# Patient Record
Sex: Female | Born: 1965 | Race: Black or African American | Hispanic: No | Marital: Married | Smoking: Never smoker
Health system: Southern US, Community
[De-identification: ages and names within clinical notes are randomized; demographics above are authoritative.]

## PROBLEM LIST (undated history)

## (undated) DIAGNOSIS — I10 Essential (primary) hypertension: Secondary | ICD-10-CM

## (undated) DIAGNOSIS — D573 Sickle-cell trait: Secondary | ICD-10-CM

---

## 1997-09-25 ENCOUNTER — Emergency Department (HOSPITAL_COMMUNITY): Admission: EM | Admit: 1997-09-25 | Discharge: 1997-09-25 | Payer: Self-pay | Admitting: Emergency Medicine

## 1998-09-25 ENCOUNTER — Emergency Department (HOSPITAL_COMMUNITY): Admission: EM | Admit: 1998-09-25 | Discharge: 1998-09-25 | Payer: Self-pay | Admitting: Emergency Medicine

## 2000-11-13 ENCOUNTER — Emergency Department (HOSPITAL_COMMUNITY): Admission: EM | Admit: 2000-11-13 | Discharge: 2000-11-13 | Payer: Self-pay | Admitting: *Deleted

## 2001-10-29 ENCOUNTER — Emergency Department (HOSPITAL_COMMUNITY): Admission: EM | Admit: 2001-10-29 | Discharge: 2001-10-29 | Payer: Self-pay | Admitting: Emergency Medicine

## 2003-06-24 ENCOUNTER — Emergency Department (HOSPITAL_COMMUNITY): Admission: EM | Admit: 2003-06-24 | Discharge: 2003-06-24 | Payer: Self-pay | Admitting: Emergency Medicine

## 2005-06-21 ENCOUNTER — Emergency Department (HOSPITAL_COMMUNITY): Admission: EM | Admit: 2005-06-21 | Discharge: 2005-06-21 | Payer: Self-pay | Admitting: Emergency Medicine

## 2006-02-05 ENCOUNTER — Emergency Department (HOSPITAL_COMMUNITY): Admission: EM | Admit: 2006-02-05 | Discharge: 2006-02-05 | Payer: Self-pay | Admitting: Emergency Medicine

## 2006-05-09 ENCOUNTER — Emergency Department (HOSPITAL_COMMUNITY): Admission: EM | Admit: 2006-05-09 | Discharge: 2006-05-09 | Payer: Self-pay | Admitting: Emergency Medicine

## 2006-05-23 ENCOUNTER — Emergency Department (HOSPITAL_COMMUNITY): Admission: EM | Admit: 2006-05-23 | Discharge: 2006-05-23 | Payer: Self-pay | Admitting: Emergency Medicine

## 2006-08-26 ENCOUNTER — Emergency Department (HOSPITAL_COMMUNITY): Admission: EM | Admit: 2006-08-26 | Discharge: 2006-08-27 | Payer: Self-pay | Admitting: Emergency Medicine

## 2007-07-27 ENCOUNTER — Emergency Department (HOSPITAL_COMMUNITY): Admission: EM | Admit: 2007-07-27 | Discharge: 2007-07-27 | Payer: Self-pay | Admitting: Emergency Medicine

## 2007-10-13 ENCOUNTER — Emergency Department (HOSPITAL_COMMUNITY): Admission: EM | Admit: 2007-10-13 | Discharge: 2007-10-13 | Payer: Self-pay | Admitting: Emergency Medicine

## 2010-11-16 LAB — TYPE AND SCREEN: Antibody Screen: NEGATIVE

## 2010-11-16 LAB — URINE MICROSCOPIC-ADD ON

## 2010-11-16 LAB — CBC
HCT: 25.4 — ABNORMAL LOW
Platelets: 385
RBC: 4.22
WBC: 6.8

## 2010-11-16 LAB — COMPREHENSIVE METABOLIC PANEL
AST: 17
Albumin: 3.5
Alkaline Phosphatase: 46
BUN: 9
CO2: 28
Chloride: 107
GFR calc non Af Amer: >60
Potassium: 3.7
Total Bilirubin: 0.5

## 2010-11-16 LAB — DIFFERENTIAL
Band Neutrophils: 0
Blasts: 0
Lymphocytes Relative: 28
Metamyelocytes Relative: 0
Promyelocytes Absolute: 0

## 2010-11-16 LAB — PREGNANCY, URINE: Preg Test, Ur: NEGATIVE

## 2010-11-16 LAB — URINALYSIS, ROUTINE W REFLEX MICROSCOPIC
Glucose, UA: NEGATIVE
Leukocytes, UA: NEGATIVE
pH: 7

## 2010-11-16 LAB — OCCULT BLOOD X 1 CARD TO LAB, STOOL: Fecal Occult Bld: NEGATIVE

## 2011-05-29 ENCOUNTER — Emergency Department (HOSPITAL_COMMUNITY)
Admission: EM | Admit: 2011-05-29 | Discharge: 2011-05-30 | Disposition: A | Discharge status: Home / Self Care | Payer: BC Managed Care – PPO | Attending: Emergency Medicine | Admitting: Emergency Medicine

## 2011-05-29 DIAGNOSIS — R51 Headache: Secondary | ICD-10-CM | POA: Insufficient documentation

## 2011-05-29 DIAGNOSIS — R42 Dizziness and giddiness: Secondary | ICD-10-CM | POA: Insufficient documentation

## 2011-05-29 DIAGNOSIS — I10 Essential (primary) hypertension: Secondary | ICD-10-CM | POA: Insufficient documentation

## 2011-05-29 HISTORY — DX: Sickle-cell trait: D57.3

## 2011-05-29 HISTORY — DX: Essential (primary) hypertension: I10

## 2011-05-30 ENCOUNTER — Encounter (HOSPITAL_COMMUNITY): Payer: Self-pay

## 2011-05-30 ENCOUNTER — Emergency Department (HOSPITAL_COMMUNITY): Payer: BC Managed Care – PPO

## 2011-05-30 MED ORDER — LORAZEPAM 2 MG/ML IJ SOLN
1.0000 mg | Freq: Once | INTRAMUSCULAR | Status: AC
  Administered 2011-05-30: 1 mg via INTRAVENOUS
  Filled 2011-05-30: qty 1

## 2011-05-30 MED ORDER — MECLIZINE HCL 25 MG PO TABS
25.0000 mg | ORAL_TABLET | Freq: Once | ORAL | Status: AC
  Administered 2011-05-30: 25 mg via ORAL
  Filled 2011-05-30: qty 1

## 2011-05-30 MED ORDER — SODIUM CHLORIDE 0.9 % IV BOLUS (SEPSIS): 1000.0000 mL | Freq: Once | INTRAVENOUS | Status: DC

## 2011-05-30 MED ORDER — MECLIZINE HCL 25 MG PO TABS: 25.0000 mg | ORAL_TABLET | Freq: Four times a day (QID) | ORAL | Status: AC

## 2011-05-30 NOTE — ED Provider Notes (Addendum)
History     CSN: [id number]  Arrival date & time 05/29/11  2348   First MD Initiated Contact with Patient 05/30/11 0007     Chief complaint dizziness  (Consider location/radiation/quality/duration/timing/severity/associated sxs/prior treatment) HPI History is provided by patient. Went to bed tonight and sat up with sudden onset of severe dizziness. She has had some recent allergies with eye irritation and congestion but no history of dizziness like this. She is able to walk. No weakness or numbness. She has sickle cell trait but no sickle cell disease. She denies any headache. No neck pain. No ear pain or drainage. No sore throat. No sick contacts. No head injury. No change in vision or speech. Patient denies any medications. States she's been under significant amount of stress recently.   Past Medical History  Diagnosis Date  . Sickle cell trait   . Hypertension     History reviewed. No pertinent past surgical history.  No family history on file.  History  Substance Use Topics  . Smoking status: Never Smoker   . Smokeless tobacco: Not on file  . Alcohol Use: No    OB History    Grav Para Term Preterm Abortions TAB SAB Ect Mult Living                  Review of Systems  Constitutional: Negative for fever and chills.  HENT: Negative for neck pain and neck stiffness.   Eyes: Negative for pain.  Respiratory: Negative for shortness of breath.   Cardiovascular: Negative for chest pain.  Gastrointestinal: Negative for abdominal pain.  Genitourinary: Negative for dysuria.  Musculoskeletal: Negative for back pain.  Skin: Negative for rash.  Neurological: Positive for dizziness. Negative for seizures, syncope, speech difficulty, weakness and numbness.  All other systems reviewed and are negative.    Allergies  Review of patient's allergies indicates no known allergies.  Home Medications   Current Outpatient Rx  Name Route Sig Dispense Refill  . HYDROCHLOROTHIAZIDE  25 MG PO TABS Oral Take 25 mg by mouth daily.      BP 135/74  Pulse 60  Temp(Src) 97.4 F (36.3 C) (Oral)  Resp 16  Ht 5\' 6"  (1.676 m)  Wt 155 lb (70.308 kg)  BMI 25.02 kg/m2  SpO2 100%  Physical Exam  Constitutional: She is oriented to person, place, and time. She appears well-developed and well-nourished.  HENT:  Head: Normocephalic and atraumatic.  Right Ear: External ear normal.  Left Ear: External ear normal.  Nose: Nose normal.  Mouth/Throat: Oropharynx is clear and moist. No oropharyngeal exudate.  Eyes: Conjunctivae and EOM are normal. Pupils are equal, round, and reactive to light.  Neck: Full passive range of motion without pain. Neck supple. No thyromegaly present.       No meningismus  Cardiovascular: Normal rate, regular rhythm, S1 normal, S2 normal and intact distal pulses.   Pulmonary/Chest: Effort normal and breath sounds normal.  Abdominal: Soft. Bowel sounds are normal. There is no tenderness. There is no CVA tenderness.  Musculoskeletal: Normal range of motion.  Neurological: She is alert and oriented to person, place, and time. She has normal strength and normal reflexes. No cranial nerve deficit or sensory deficit. She displays a negative Romberg sign. GCS eye subscore is 4. GCS verbal subscore is 5. GCS motor subscore is 6.       Normal Gait  Skin: Skin is warm and dry. No rash noted. No cyanosis. Nails show no clubbing.  Psychiatric: She has a  normal mood and affect. Her speech is normal and behavior is normal.    ED Course  Procedures (including critical care time)  Labs Reviewed - No data to display Ct Head Wo Contrast  05/30/2011  *RADIOLOGY REPORT*  Clinical Data: Seven generalized frontal headache.  Dizziness.  CT HEAD WITHOUT CONTRAST  Technique:  Contiguous axial images were obtained from the base of the skull through the vertex without contrast.  Comparison: None.  Findings: No mass effect, midline shift, or acute intracranial hemorrhage.   IMPRESSION: Negative head CT.  Original Report Authenticated By: Donavan Burnet, M.D.     Date: 05/30/2011  Rate: 57  Rhythm: sinus bradycardia  QRS Axis: normal  Intervals: normal  ST/T Wave abnormalities: early repolarization  Conduction Disutrbances:none  Narrative Interpretation: Mild diffuse ST elevation with some PR depression  Old EKG Reviewed: none available   IV fluids. Ativan. Antivert.  Patient ambulates with normal gait. No neuro deficits.  Recheck after medications feeling improved. No change on normal neuro exam MDM   Location suggests benign positional vertigo. Reproducible symptoms with lateral deviation of eye movements. No significant nystagmus appreciated. Presentation does not suggest stroke and CT scan reviewed as above. Plan discharge home with vertigo precautions, Antivert, primary care followup as needed. Reliable historian verbalizes understanding all discharge and followup instructions and agrees to strict return precautions        Sunnie Nielsen, MD 05/30/11 0132  Sunnie Nielsen, MD 05/30/11 [phone]

## 2011-05-30 NOTE — Discharge Instructions (Signed)

## 2011-05-30 NOTE — ED Notes (Signed)
Pt states she felt dizzy when she got up to use the bathroom at home. She stated she had to crawl. Pt. Is alert and oriented x 4. Denies H/A. Stated that she gets these episodes frequent.

## 2011-05-30 NOTE — ED Notes (Signed)
Pt report sudden onset of generalized frontal area headache.  Pt c/o of dizziness while in bed and was unable to get up- "room was spinning"

## 2013-03-07 ENCOUNTER — Encounter (HOSPITAL_COMMUNITY): Payer: Self-pay | Admitting: Emergency Medicine

## 2013-03-07 ENCOUNTER — Emergency Department (HOSPITAL_COMMUNITY)
Admission: EM | Admit: 2013-03-07 | Discharge: 2013-03-07 | Disposition: A | Discharge status: Home / Self Care | Payer: BC Managed Care – PPO | Attending: Emergency Medicine | Admitting: Emergency Medicine

## 2013-03-07 DIAGNOSIS — T6391XA Toxic effect of contact with unspecified venomous animal, accidental (unintentional), initial encounter: Secondary | ICD-10-CM | POA: Insufficient documentation

## 2013-03-07 DIAGNOSIS — IMO0002 Reserved for concepts with insufficient information to code with codable children: Secondary | ICD-10-CM | POA: Insufficient documentation

## 2013-03-07 DIAGNOSIS — L0390 Cellulitis, unspecified: Secondary | ICD-10-CM

## 2013-03-07 DIAGNOSIS — I10 Essential (primary) hypertension: Secondary | ICD-10-CM | POA: Insufficient documentation

## 2013-03-07 DIAGNOSIS — Y9389 Activity, other specified: Secondary | ICD-10-CM | POA: Insufficient documentation

## 2013-03-07 DIAGNOSIS — T63391A Toxic effect of venom of other spider, accidental (unintentional), initial encounter: Secondary | ICD-10-CM | POA: Insufficient documentation

## 2013-03-07 DIAGNOSIS — Z862 Personal history of diseases of the blood and blood-forming organs and certain disorders involving the immune mechanism: Secondary | ICD-10-CM | POA: Insufficient documentation

## 2013-03-07 DIAGNOSIS — Y929 Unspecified place or not applicable: Secondary | ICD-10-CM | POA: Insufficient documentation

## 2013-03-07 MED ORDER — SULFAMETHOXAZOLE-TRIMETHOPRIM 800-160 MG PO TABS: 1.0000 | ORAL_TABLET | Freq: Two times a day (BID) | ORAL | Status: DC

## 2013-03-07 MED ORDER — CEPHALEXIN 500 MG PO CAPS: 500.0000 mg | ORAL_CAPSULE | Freq: Four times a day (QID) | ORAL | Status: DC

## 2013-03-07 NOTE — ED Notes (Signed)
Pt. Stated, i was cleaning and a spider got on my left forearm and bit me.  Left forearm red

## 2013-03-07 NOTE — ED Notes (Signed)
PT comfortable with d/c and f/u instructions. Prescriptions x2 

## 2013-03-07 NOTE — ED Provider Notes (Signed)
CSN: [id number]     Arrival date & time 03/07/13  1627 History   This chart was scribed for non-physician practitioner Arthor Captain, PA-C, working with Juliet Rude. Rubin Payor, MD by Donne Anon, ED Scribe. This patient was seen in room TR05C/TR05C and the patient's care was started at 1652.   First MD Initiated Contact with Patient 03/07/13 1652     Chief Complaint  Patient presents with  . spider bite     The history is provided by the patient. No language interpreter was used.   HPI Comments: Cassie Reed is a 48 y.o. female with hx of HTN, who presents to the Emergency Department complaining of gradual onset, gradually worsening left forearm redness and swelling that began yesterday morning. She states she saw a small spider bit her arm yesterday morning and the symptoms began shortly after. She denies fever, chills, shakes or any other symptoms.   Past Medical History  Diagnosis Date  . Sickle cell trait   . Hypertension    History reviewed. No pertinent past surgical history. No family history on file. History  Substance Use Topics  . Smoking status: Never Smoker   . Smokeless tobacco: Not on file  . Alcohol Use: No   OB History   Grav Para Term Preterm Abortions TAB SAB Ect Mult Living                 Review of Systems  Constitutional: Negative for fever.  Respiratory: Negative for cough.   Gastrointestinal: Negative for vomiting.  Skin: Positive for color change and rash.    Allergies  Review of patient's allergies indicates no known allergies.  Home Medications   Current Outpatient Rx  Name  Route  Sig  Dispense  Refill  . hydrochlorothiazide (HYDRODIURIL) 25 MG tablet   Oral   Take 25 mg by mouth daily.          BP 162/102  Pulse 85  Temp(Src) 97.6 F (36.4 C) (Oral)  Resp 18  Ht 5\' 6"  (1.676 m)  Wt 173 lb (78.472 kg)  BMI 27.94 kg/m2  SpO2 98%  Physical Exam  Nursing note and vitals reviewed. Constitutional: She appears well-developed and  well-nourished. No distress.  HENT:  Head: Normocephalic and atraumatic.  Eyes: Conjunctivae are normal.  Neck: Neck supple. No tracheal deviation present.  Cardiovascular: Normal rate.   Pulmonary/Chest: Effort normal. No respiratory distress.  Musculoskeletal: Normal range of motion.  Neurological: She is alert.  Skin: Skin is warm and dry. Rash noted.  Left arm just inferior to elbow on extensor surface has central area of excoriation surrounded by swelling and erythema with extension about the elbow. No streaking. Tender to palpation. No discharge.   Psychiatric: She has a normal mood and affect. Her behavior is normal.    ED Course  Procedures (including critical care time) DIAGNOSTIC STUDIES: Oxygen Saturation is 98% on RA, normal by my interpretation.    COORDINATION OF CARE: 6:26 PM Discussed treatment plan with pt at bedside and pt agreed to plan. Return precautions advised. Will discharge home with Spetra DS and Keflex.    Labs Review Labs Reviewed - No data to display Imaging Review No results found.  EKG Interpretation   None       MDM   1. Cellulitis    .patient with cellulitis of the the left forearm She will be discharged on bactrim and keflex. Discussed return precautions. No signs fo compartment syndrome or necrosis.   I personally  performed the services described in this documentation, which was scribed in my presence. The recorded information has been reviewed and is accurate.    Arthor Captainbigail Krizia Flight, PA-C 03/08/13 2116

## 2013-03-07 NOTE — Discharge Instructions (Signed)
You have been diagnosed with cellulitis. I am discharging you with the following medications: 1)bactrim and keflex Please take all of your antibiotics until finished!   You may develop abdominal discomfort or diarrhea from the antibiotic.  You may help offset this with probiotics which you can buy or get in yogurt. Do not eat  or take the probiotics until 2 hours after your antibiotic.   HOME CARE INSTRUCTIONS   Take your antibiotics as directed. Finish them even if you start to feel better.  Keep the infected arm or leg elevated to reduce swelling.  Apply a warm cloth to the affected area up to 4 times per day to relieve pain.  Only take over-the-counter or prescription medicines for pain, discomfort, or fever as directed by your caregiver.  Keep all follow-up appointments as directed by your caregiver. SEEK MEDICAL CARE IF:   You notice red streaks coming from the infected area.  Your red area gets larger or turns dark in color.  Your bone or joint underneath the infected area becomes painful after the skin has healed.  Your infection returns in the same area or another area.  You notice a swollen bump in the infected area.  You develop new symptoms. SEEK IMMEDIATE MEDICAL CARE IF:   You have a fever.  You feel very sleepy.  You develop vomiting or diarrhea.  You have a general ill feeling (malaise) with muscle aches and pains.

## 2013-03-07 NOTE — ED Notes (Signed)
The pt is c/o  A redness and swelling of her lt forearm just below the elbow.  She saw a spider on her arm yesterday and the pain and redness started after that

## 2013-03-12 NOTE — ED Provider Notes (Signed)
Medical screening examination/treatment/procedure(s) were performed by non-physician practitioner and as supervising physician I was immediately available for consultation/collaboration.  EKG Interpretation   None        Caydon Feasel R. Darrik Richman, MD 03/12/13 2046 

## 2013-09-20 ENCOUNTER — Encounter (HOSPITAL_COMMUNITY): Payer: Self-pay | Admitting: Emergency Medicine

## 2013-09-20 ENCOUNTER — Emergency Department (HOSPITAL_COMMUNITY): Payer: BC Managed Care – PPO

## 2013-09-20 ENCOUNTER — Emergency Department (HOSPITAL_COMMUNITY)
Admission: EM | Admit: 2013-09-20 | Discharge: 2013-09-20 | Disposition: A | Discharge status: Home / Self Care | Payer: BC Managed Care – PPO | Attending: Emergency Medicine | Admitting: Emergency Medicine

## 2013-09-20 DIAGNOSIS — S0993XA Unspecified injury of face, initial encounter: Secondary | ICD-10-CM | POA: Insufficient documentation

## 2013-09-20 DIAGNOSIS — Z862 Personal history of diseases of the blood and blood-forming organs and certain disorders involving the immune mechanism: Secondary | ICD-10-CM | POA: Diagnosis not present

## 2013-09-20 DIAGNOSIS — Y9289 Other specified places as the place of occurrence of the external cause: Secondary | ICD-10-CM | POA: Diagnosis not present

## 2013-09-20 DIAGNOSIS — I10 Essential (primary) hypertension: Secondary | ICD-10-CM | POA: Insufficient documentation

## 2013-09-20 DIAGNOSIS — M62838 Other muscle spasm: Secondary | ICD-10-CM

## 2013-09-20 DIAGNOSIS — S199XXA Unspecified injury of neck, initial encounter: Secondary | ICD-10-CM | POA: Diagnosis present

## 2013-09-20 DIAGNOSIS — Z792 Long term (current) use of antibiotics: Secondary | ICD-10-CM | POA: Insufficient documentation

## 2013-09-20 DIAGNOSIS — W1809XA Striking against other object with subsequent fall, initial encounter: Secondary | ICD-10-CM | POA: Diagnosis not present

## 2013-09-20 DIAGNOSIS — Y9389 Activity, other specified: Secondary | ICD-10-CM | POA: Insufficient documentation

## 2013-09-20 DIAGNOSIS — Z79899 Other long term (current) drug therapy: Secondary | ICD-10-CM | POA: Diagnosis not present

## 2013-09-20 MED ORDER — IBUPROFEN 800 MG PO TABS: 800.0000 mg | ORAL_TABLET | Freq: Three times a day (TID) | ORAL | Status: DC

## 2013-09-20 MED ORDER — METHOCARBAMOL 500 MG PO TABS: 500.0000 mg | ORAL_TABLET | Freq: Two times a day (BID) | ORAL | Status: DC

## 2013-09-20 MED ORDER — METHOCARBAMOL 500 MG PO TABS
500.0000 mg | ORAL_TABLET | Freq: Once | ORAL | Status: AC
  Administered 2013-09-20: 500 mg via ORAL
  Filled 2013-09-20: qty 1

## 2013-09-20 MED ORDER — IBUPROFEN 800 MG PO TABS
800.0000 mg | ORAL_TABLET | Freq: Once | ORAL | Status: AC
  Administered 2013-09-20: 800 mg via ORAL
  Filled 2013-09-20: qty 1

## 2013-09-20 NOTE — Discharge Instructions (Signed)
Please follow up with your primary care physician in 1-2 days. If you do not have one please call the [facility] number listed above. Please take pain medication and/or muscle relaxants as prescribed and as needed for pain. Please do not drive on narcotic pain medication or on muscle relaxants. Please read all discharge instructions and return precautions.  ° °Muscle Cramps and Spasms °Muscle cramps and spasms occur when a muscle or muscles tighten and you have no control over this tightening (involuntary muscle contraction). They are a common problem and can develop in any muscle. The most common place is in the calf muscles of the leg. Both muscle cramps and muscle spasms are involuntary muscle contractions, but they also have differences:  °· Muscle cramps are sporadic and painful. They may last a few seconds to a quarter of an hour. Muscle cramps are often more forceful and last longer than muscle spasms. °· Muscle spasms may or may not be painful. They may also last just a few seconds or much longer. °CAUSES  °It is uncommon for cramps or spasms to be due to a serious underlying problem. In many cases, the cause of cramps or spasms is unknown. Some common causes are:  °· Overexertion.   °· Overuse from repetitive motions (doing the same thing over and over).   °· Remaining in a certain position for a long period of time.   °· Improper preparation, form, or technique while performing a sport or activity.   °· Dehydration.   °· Injury.   °· Side effects of some medicines.   °· Abnormally low levels of the salts and ions in your blood (electrolytes), especially potassium and calcium. This could happen if you are taking water pills (diuretics) or you are pregnant.   °Some underlying medical problems can make it more likely to develop cramps or spasms. These include, but are not limited to:  °· Diabetes.   °· Parkinson disease.   °· Hormone disorders, such as thyroid problems.   °· Alcohol  abuse.   °· Diseases specific to muscles, joints, and bones.   °· Blood vessel disease where not enough blood is getting to the muscles.   °HOME CARE INSTRUCTIONS  °· Stay well hydrated. Drink enough water and fluids to keep your urine clear or pale yellow. °· It may be helpful to massage, stretch, and relax the affected muscle. °· For tight or tense muscles, use a warm towel, heating pad, or hot shower water directed to the affected area. °· If you are sore or have pain after a cramp or spasm, applying ice to the affected area may relieve discomfort. °¨ Put ice in a plastic bag. °¨ Place a towel between your skin and the bag. °¨ Leave the ice on for 15-20 minutes, 03-04 times a day. °· Medicines used to treat a known cause of cramps or spasms may help reduce their frequency or severity. Only take over-the-counter or prescription medicines as directed by your caregiver. °SEEK MEDICAL CARE IF:  °Your cramps or spasms get more severe, more frequent, or do not improve over time.  °MAKE SURE YOU:  °· Understand these instructions. °· Will watch your condition. °· Will get help right away if you are not doing well or get worse. °Document Released: 07/10/2001 Document Revised: 05/15/2012 Document Reviewed: 01/05/2012 °[employer]® Patient Information ©2015 [employer]. This information is not intended to replace advice given to you by your health care provider. Make sure you discuss any questions you have with your health care provider. ° °

## 2013-09-20 NOTE — ED Provider Notes (Signed)
Medical screening examination/treatment/procedure(s) were performed by non-physician practitioner and as supervising physician I was immediately available for consultation/collaboration.   Toy BakerAnthony T Lumi Winslett, MD 09/20/13 [phone]
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
## 2014-09-29 ENCOUNTER — Emergency Department (HOSPITAL_COMMUNITY)
Admission: EM | Admit: 2014-09-29 | Discharge: 2014-09-29 | Disposition: A | Discharge status: Home / Self Care | Payer: BLUE CROSS/BLUE SHIELD | Attending: Emergency Medicine | Admitting: Emergency Medicine

## 2014-09-29 ENCOUNTER — Encounter (HOSPITAL_COMMUNITY): Payer: Self-pay

## 2014-09-29 DIAGNOSIS — I10 Essential (primary) hypertension: Secondary | ICD-10-CM | POA: Diagnosis not present

## 2014-09-29 DIAGNOSIS — M5440 Lumbago with sciatica, unspecified side: Secondary | ICD-10-CM | POA: Insufficient documentation

## 2014-09-29 DIAGNOSIS — Z862 Personal history of diseases of the blood and blood-forming organs and certain disorders involving the immune mechanism: Secondary | ICD-10-CM | POA: Diagnosis not present

## 2014-09-29 DIAGNOSIS — Z79899 Other long term (current) drug therapy: Secondary | ICD-10-CM | POA: Diagnosis not present

## 2014-09-29 DIAGNOSIS — M545 Low back pain: Secondary | ICD-10-CM | POA: Diagnosis present

## 2014-09-29 MED ORDER — IBUPROFEN 200 MG PO TABS
400.0000 mg | ORAL_TABLET | Freq: Once | ORAL | Status: AC
  Administered 2014-09-29: 400 mg via ORAL
  Filled 2014-09-29: qty 2

## 2014-09-29 MED ORDER — IBUPROFEN 400 MG PO TABS: 400.0000 mg | ORAL_TABLET | Freq: Three times a day (TID) | ORAL | Status: AC

## 2014-09-29 MED ORDER — DIAZEPAM 2 MG PO TABS: 2.0000 mg | ORAL_TABLET | Freq: Two times a day (BID) | ORAL | Status: AC | PRN

## 2014-09-29 MED ORDER — DIAZEPAM 2 MG PO TABS
2.0000 mg | ORAL_TABLET | Freq: Once | ORAL | Status: AC
  Administered 2014-09-29: 2 mg via ORAL
  Filled 2014-09-29: qty 1

## 2014-09-29 NOTE — ED Provider Notes (Signed)
CSN: [id number]     Arrival date & time 09/29/14  0904 History   First MD Initiated Contact with Patient 09/29/14 [phone]     Chief Complaint  Patient presents with  . 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 Feels like previous pulled muscle who went away for 2 days and then came back while she was at work again on Thursday. Patient states that the pain is resolved with rest however certain movements exacerbate the pain. She does not have any midline pain. No trauma to the back of her spine. No urinary or fecal incontinence. No fevers, dysuria. She is going through menopause and does not think she is pregnant.  Patient is a 49 y.o. female presenting with back pain.  Back Pain   Past Medical History  Diagnosis Date  . Sickle cell trait   . Hypertension    History reviewed. No pertinent past surgical history. History reviewed. No pertinent family history. Social History  Substance Use Topics  . Smoking status: Never Smoker   . Smokeless tobacco: None  . Alcohol Use: No   OB History    No data available     Review of Systems  Respiratory: Negative for cough and shortness of breath.   Musculoskeletal: Positive for back pain. Negative for gait problem, neck pain and neck stiffness.  All other systems reviewed and are negative.     Allergies  Review of patient's allergies indicates no known allergies.  Home Medications   Prior to Admission medications   Medication Sig Start Date End Date Taking? Authorizing Provider  diazepam (VALIUM) 2 MG tablet Take 1 tablet (2 mg total) by mouth every 12 (twelve) hours as needed for muscle spasms. 09/29/14   Marily Memos, MD   hydrochlorothiazide (HYDRODIURIL) 25 MG tablet Take 25 mg by mouth daily.    Historical Provider, MD  ibuprofen (ADVIL,MOTRIN) 400 MG tablet Take 1 tablet (400 mg total) by mouth 3 (three) times daily. 09/29/14 10/06/14  Barbara Cower Rakwon Letourneau, MD   BP 166/88 mmHg  Pulse 93  Temp(Src) 98.3 F (36.8 C) (Oral)  Resp 16  SpO2 100% Physical Exam  Constitutional: She is oriented to person, place, and time. She appears well-developed and well-nourished.  HENT:  Head: Normocephalic and atraumatic.  Eyes: Conjunctivae and EOM are normal. Right eye exhibits no discharge. Left eye exhibits no discharge.  Cardiovascular: Normal rate and regular rhythm.   Pulmonary/Chest: Effort normal and breath sounds normal. No respiratory distress.  Abdominal: Soft. She exhibits no distension. There is no tenderness. There is no rebound.  Musculoskeletal: Normal range of motion. She exhibits no edema or tenderness.  Neurological: She is alert and oriented to person, place, and time.  Bilateral lower extremities with equal 5 out of 5 strength in knee flexion and extension, hip flexion and extension, hip abduction and abduction. 2+ deep tendon reflexes in her patella. Normal sensation around inner thigh outer thigh and the rest of her lower extremity.  Skin: Skin is warm and dry.  Nursing note and vitals reviewed.   ED Course  Procedures (including critical care time) Labs Review Labs Reviewed - No data to display  Imaging Review No results found. I have personally reviewed and evaluated these images  and lab results as part of my medical decision-making.   EKG Interpretation None      MDM   Final diagnoses:  Right low back pain, with sciatica presence unspecified   49 year old female with likely muscular strain. We'll recommend stretching, heat pad, supportive therapy at home. Will follow up with her doctor one week if not improving. Doubt that she has any spinal abnormalities (space-occupying lesion, fracture)  without any signs or symptoms of such.  I have personally and contemperaneously reviewed labs and imaging and used in my decision making as above.   A medical screening exam was performed and I feel the patient has had an appropriate workup for their chief complaint at this time and likelihood of emergent condition existing is low. They have been counseled on decision, discharge, follow up and which symptoms necessitate immediate return to the emergency department. They or their family verbally stated understanding and agreement with plan and discharged in stable condition.      Marily Memos, MD 09/29/14 [phone]

## 2014-09-29 NOTE — ED Notes (Signed)
Pt was moving wheelchair at work 3 days ago & began having left-sided back pain/leg pain. Pt ambulatory w/ steady gait. Took ibuprofen and "some muscle spasm pills" w/o relief.

## 2015-02-20 IMAGING — CR DG CERVICAL SPINE COMPLETE 4+V
5 series · 5 of 5 positions shown · non-contrast
Comparison: None.

CLINICAL DATA: Fall.  Pain .

EXAM:
CERVICAL SPINE  4+ VIEWS

[w cervical spine lat]
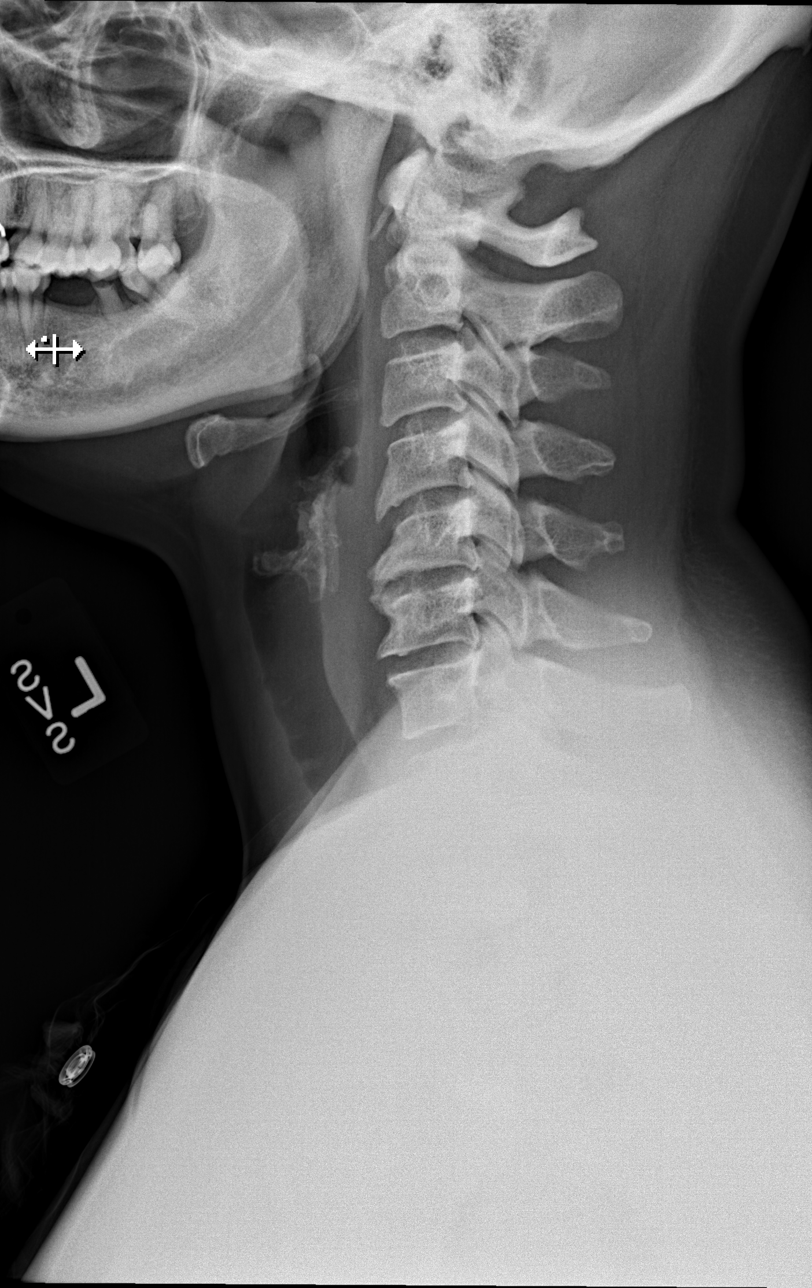

[w cervical spine ap (1 of 3)]
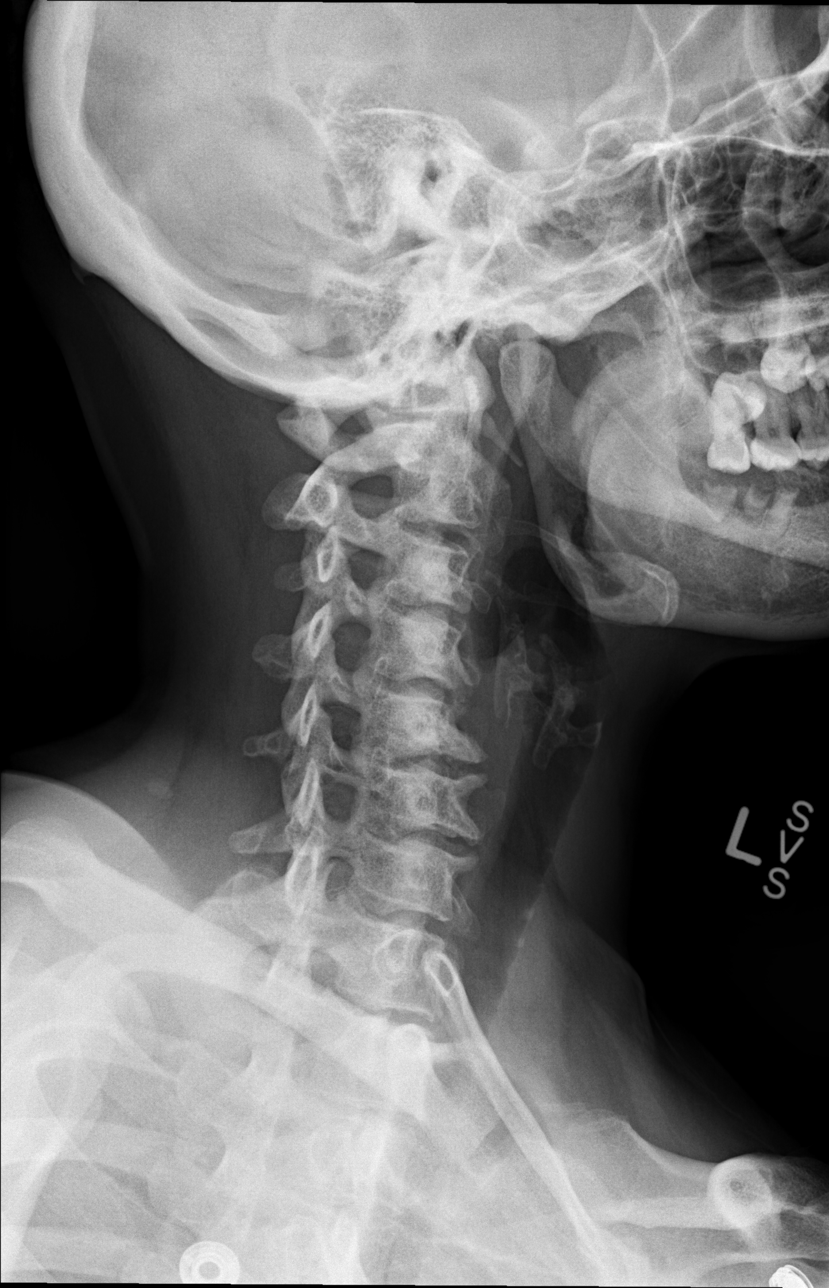

[w cervical spine ap (2 of 3)]
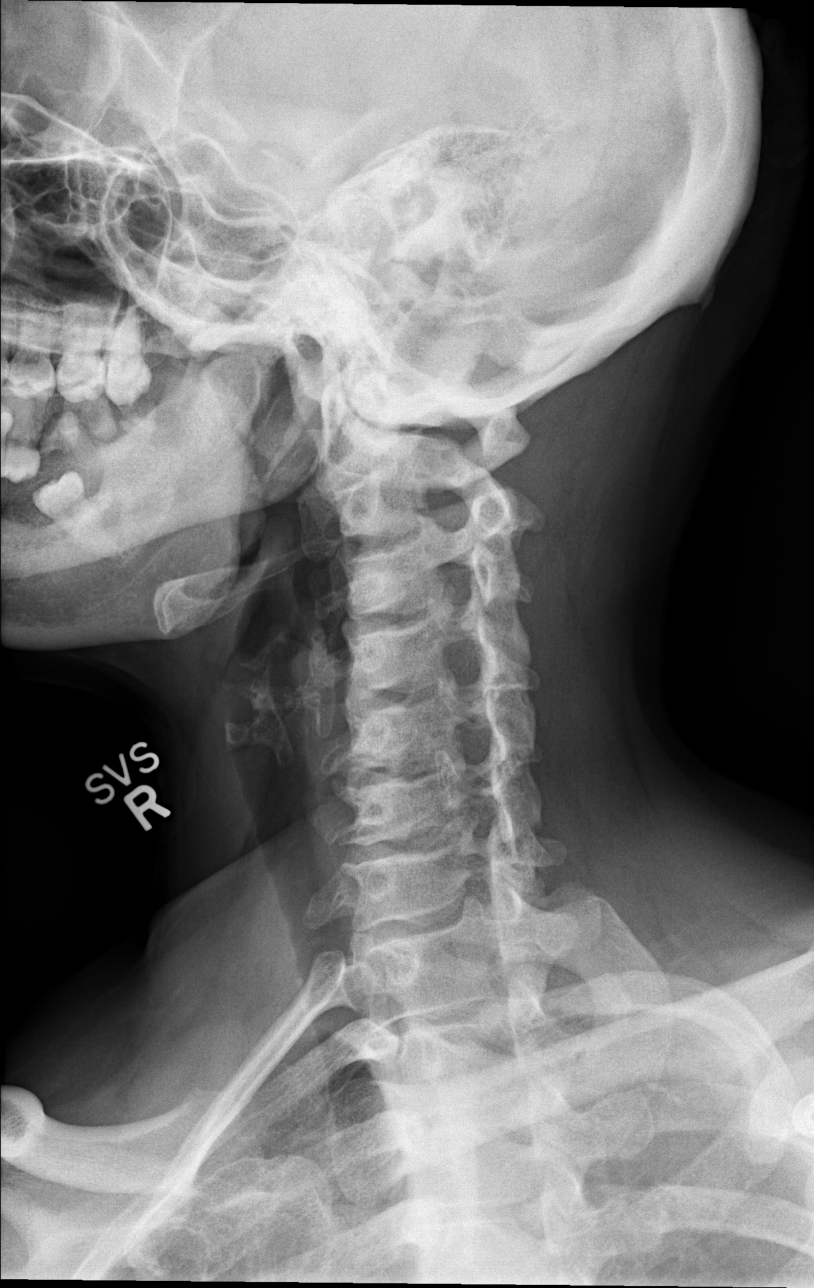

[w cervical spine ap (3 of 3)]
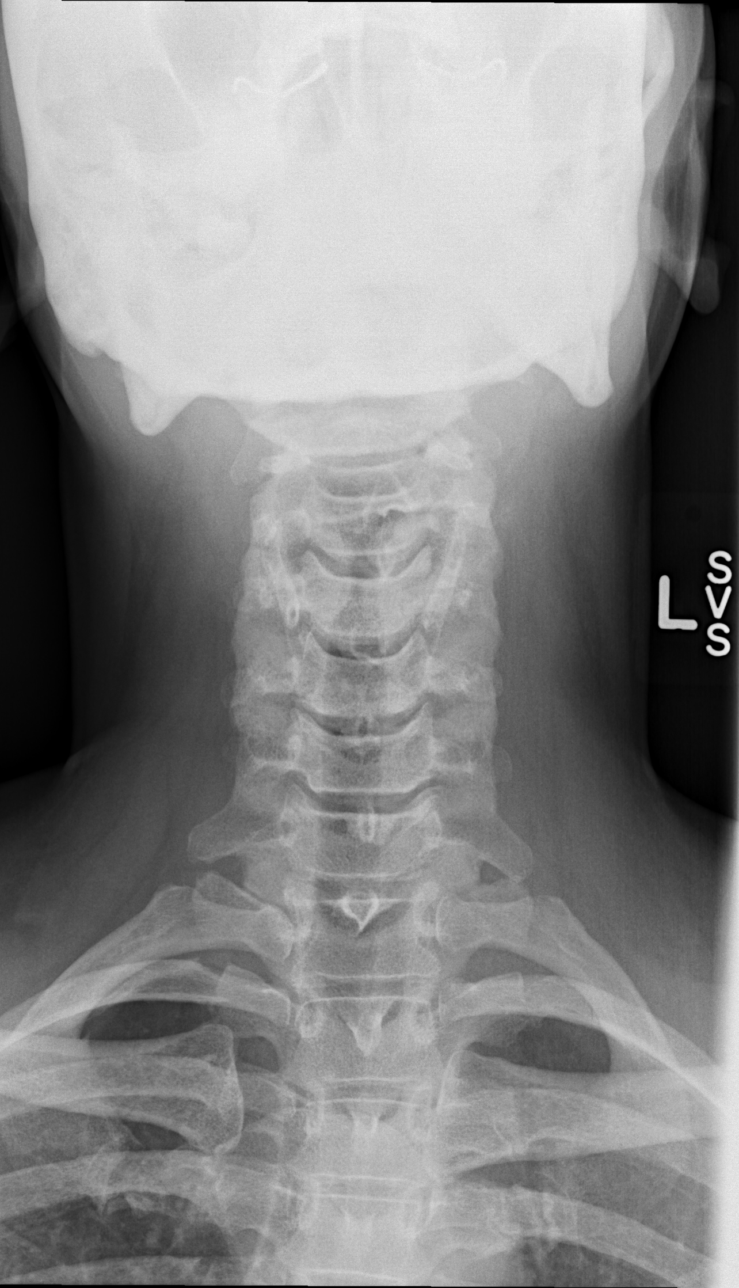

[w cervical spine odontoid]
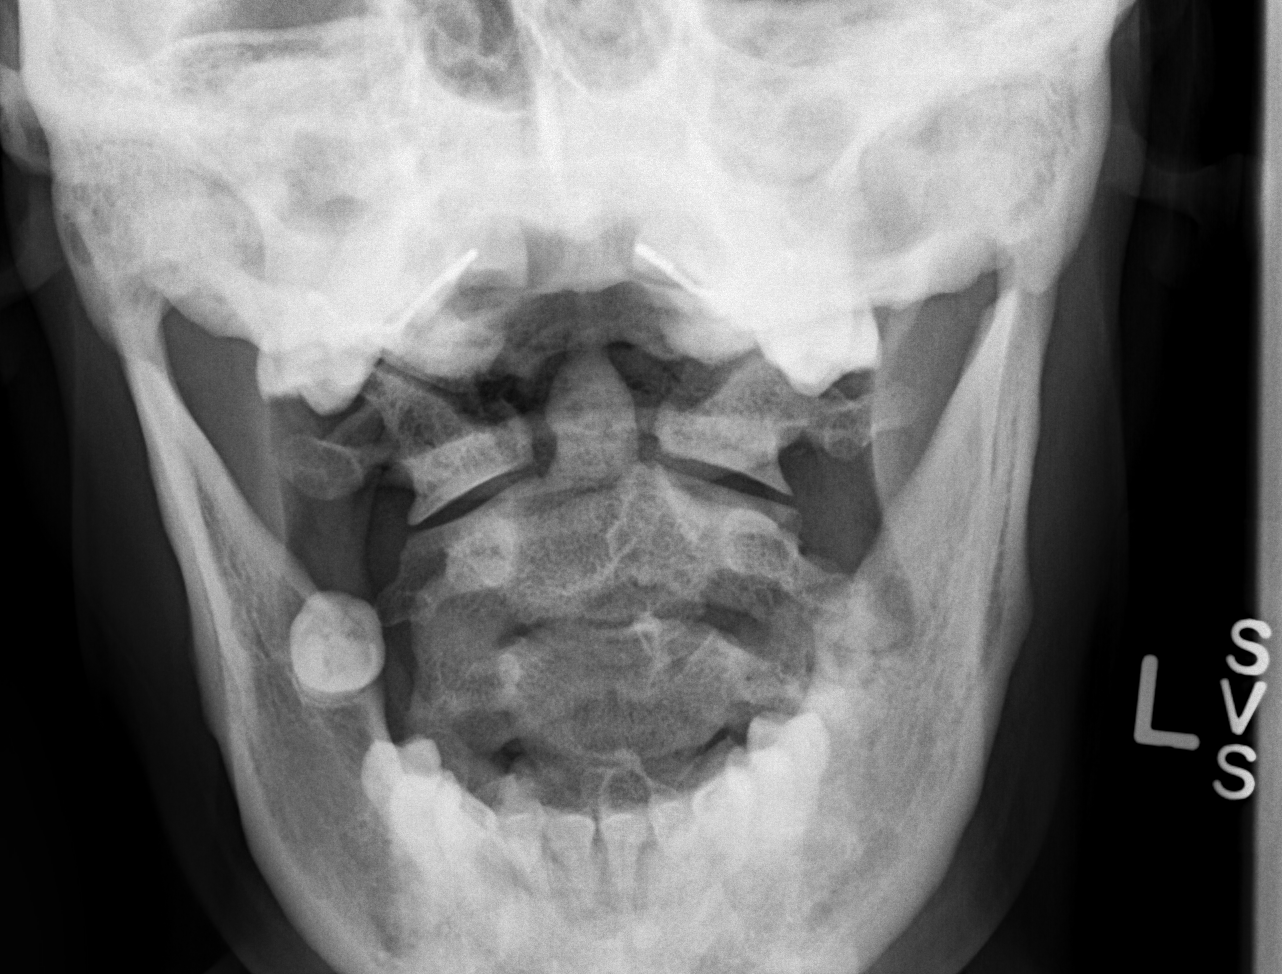

[5 of 5 positions shown; findings below may reference images not displayed]

FINDINGS: Diffuse degenerative change noted. Particular prominent disc space
loss at C5-C6 noted. Prominent endplate osteophyte formation noted
at C4-C5 and C5-C6. No evidence of fracture or dislocation.
Multilevel mild neural foraminal narrowing is present. Pulmonary
apices are clear.
IMPRESSION: DJD.  No acute abnormality.

## 2020-09-21 ENCOUNTER — Encounter (HOSPITAL_COMMUNITY): Payer: Self-pay | Admitting: Emergency Medicine

## 2020-09-21 ENCOUNTER — Emergency Department (HOSPITAL_COMMUNITY)
Admission: EM | Admit: 2020-09-21 | Discharge: 2020-09-21 | Disposition: A | Discharge status: Home / Self Care | Payer: BLUE CROSS/BLUE SHIELD | Attending: Emergency Medicine | Admitting: Emergency Medicine

## 2020-09-21 ENCOUNTER — Other Ambulatory Visit: Payer: Self-pay

## 2020-09-21 DIAGNOSIS — R0981 Nasal congestion: Secondary | ICD-10-CM | POA: Insufficient documentation

## 2020-09-21 DIAGNOSIS — H9203 Otalgia, bilateral: Secondary | ICD-10-CM | POA: Insufficient documentation

## 2020-09-21 DIAGNOSIS — I10 Essential (primary) hypertension: Secondary | ICD-10-CM | POA: Insufficient documentation

## 2020-09-21 DIAGNOSIS — Z20822 Contact with and (suspected) exposure to covid-19: Secondary | ICD-10-CM | POA: Insufficient documentation

## 2020-09-21 DIAGNOSIS — R0982 Postnasal drip: Secondary | ICD-10-CM | POA: Insufficient documentation

## 2020-09-21 DIAGNOSIS — Z79899 Other long term (current) drug therapy: Secondary | ICD-10-CM | POA: Insufficient documentation

## 2020-09-21 DIAGNOSIS — J029 Acute pharyngitis, unspecified: Secondary | ICD-10-CM | POA: Insufficient documentation

## 2020-09-21 LAB — GROUP A STREP BY PCR: Group A Strep by PCR: NOT DETECTED

## 2020-09-21 LAB — RESP PANEL BY RT-PCR (FLU A&B, COVID) ARPGX2
Influenza A by PCR: NEGATIVE
Influenza B by PCR: NEGATIVE
SARS Coronavirus 2 by RT PCR: NEGATIVE

## 2020-09-21 MED ORDER — FLUTICASONE PROPIONATE 50 MCG/ACT NA SUSP: 1.0000 | Freq: Every day | NASAL | 0 refills | Status: AC

## 2020-09-21 MED ORDER — NAPROXEN 500 MG PO TABS: 500.0000 mg | ORAL_TABLET | Freq: Two times a day (BID) | ORAL | 0 refills | Status: AC | PRN

## 2020-09-21 MED ORDER — DEXAMETHASONE SODIUM PHOSPHATE 10 MG/ML IJ SOLN
10.0000 mg | Freq: Once | INTRAMUSCULAR | Status: AC
  Administered 2020-09-21: 10 mg via INTRAMUSCULAR
  Filled 2020-09-21: qty 1

## 2020-09-21 NOTE — ED Provider Notes (Signed)
[facility] EMERGENCY DEPARTMENT Provider Note   CSN: [id number] Arrival date & time: 09/21/20  0730     History Chief Complaint  Patient presents with   Sore Throat    Cassie Reed is a 55 y.o. female with a hx of htn who presents to the ED with complaints of sore throat x 3 days. Patient reports sore throat with congestion, post nasal drip, and bilateral ear pain. No alleviating/aggravating factors. Tried alka seltzer at home without relief. She denies fever, chills, dyspnea, cough, chest pain, or vomiting.   HPI     Past Medical History:  Diagnosis Date   Hypertension    Sickle cell trait (HCC)     There are no problems to display for this patient.   History reviewed. No pertinent surgical history.   OB History   No obstetric history on file.     History reviewed. No pertinent family history.  Social History   Tobacco Use   Smoking status: Never   Smokeless tobacco: Never  Substance Use Topics   Alcohol use: No   Drug use: No    Home Medications Prior to Admission medications   Medication Sig Start Date End Date Taking? Authorizing Provider  fluticasone (FLONASE) 50 MCG/ACT nasal spray Place 1 spray into both nostrils daily. 09/21/20  Yes Kaire Stary R, PA-C  naproxen (NAPROSYN) 500 MG tablet Take 1 tablet (500 mg total) by mouth 2 (two) times daily as needed for moderate pain. 09/21/20  Yes Amario Longmore R, PA-C  diazepam (VALIUM) 2 MG tablet Take 1 tablet (2 mg total) by mouth every 12 (twelve) hours as needed for muscle spasms. 09/29/14   Mesner, Barbara Cower, MD  hydrochlorothiazide (HYDRODIURIL) 25 MG tablet Take 25 mg by mouth daily.    [provider]    Allergies    Patient has no known allergies.  Review of Systems   Review of Systems  Constitutional:  Negative for chills and fever.  HENT:  Positive for congestion, ear pain, postnasal drip and sore throat.   Respiratory:  Negative for cough and shortness of  breath.   Cardiovascular:  Negative for chest pain.  Gastrointestinal:  Negative for vomiting.  Neurological:  Negative for syncope.  All other systems reviewed and are negative.  Physical Exam Updated Vital Signs BP (!) 175/93 (BP Location: Right Arm)   Pulse 95   Temp 98.9 F (37.2 C) (Oral)   Resp 17   Ht 5\' 9"  (1.753 m)   Wt 78.5 kg   SpO2 98%   BMI 25.56 kg/m   Physical Exam Vitals and nursing note reviewed.  Constitutional:      General: She is not in acute distress.    Appearance: She is well-developed.  HENT:     Head: Normocephalic and atraumatic.     Right Ear: Ear canal normal. Tympanic membrane is not perforated, erythematous, retracted or bulging.     Left Ear: Ear canal normal. Tympanic membrane is not perforated, erythematous, retracted or bulging.     Ears:     Comments: No mastoid erythema/swelling/tenderness.     Nose: Congestion present.     Right Sinus: No maxillary sinus tenderness or frontal sinus tenderness.     Left Sinus: No maxillary sinus tenderness or frontal sinus tenderness.     Mouth/Throat:     Pharynx: Uvula midline. Posterior oropharyngeal erythema present. No oropharyngeal exudate.     Comments: Posterior oropharynx is symmetric appearing. Patient tolerating own secretions without  difficulty. No trismus. No drooling. No hot potato voice. No swelling beneath the tongue, submandibular compartment is soft.  Eyes:     General:        Right eye: No discharge.        Left eye: No discharge.     Conjunctiva/sclera: Conjunctivae normal.     Pupils: Pupils are equal, round, and reactive to light.  Cardiovascular:     Rate and Rhythm: Normal rate and regular rhythm.     Heart sounds: No murmur heard. Pulmonary:     Effort: Pulmonary effort is normal. No respiratory distress.     Breath sounds: Normal breath sounds. No wheezing, rhonchi or rales.  Abdominal:     General: There is no distension.     Palpations: Abdomen is soft.     Tenderness:  There is no abdominal tenderness.  Musculoskeletal:     Cervical back: Normal range of motion and neck supple. No edema or rigidity.  Lymphadenopathy:     Cervical: No cervical adenopathy.  Skin:    General: Skin is warm and dry.     Findings: No rash.  Neurological:     Mental Status: She is alert.  Psychiatric:        Behavior: Behavior normal.    ED Results / Procedures / Treatments   Labs (all labs ordered are listed, but only abnormal results are displayed) Labs Reviewed  GROUP A STREP BY PCR  RESP PANEL BY RT-PCR (FLU A&B, COVID) [id number]    EKG None  Radiology No results found.  Procedures Procedures   Medications Ordered in ED Medications  dexamethasone (DECADRON) injection 10 mg (has no administration in time range)    ED Course  I have reviewed the triage vital signs and the nursing notes.  Pertinent labs & imaging results that were available during my care of the patient were reviewed by me and considered in my medical decision making (see chart for details).    MDM Rules/Calculators/A&P                          Patient presents to the ED with URI sxs  Patient is nontoxic, in no acute distress, vitals notable for hypertension- low suspicion for HTN emergency- will need PCP Follow up.   Additional history obtained:  Additional history obtained from chart review & nursing note review.   Lab Tests:  I Ordered, reviewed, and interpreted labs, which included:  Covid/flu/strep: Negative  ED Course:  Exam is without signs of AOM, AOE, or mastoiditis. Oropharyngeal exam with mild erythema, strep negative, exam not consistent w/ RPA/PTA. No sinus tenderness. No meningeal signs. Lungs are CTA without focal adventitious sounds, no signs of increased work of breathing, doubt CAP. Abdomen nontender w/o peritoneal signs. Suspect viral vs. Allergic. Will tx supportively w/ PCP follow up.   I discussed results, treatment plan, need for follow-up, and return  precautions with the patient. Provided opportunity for questions, patient confirmed understanding and is in agreement with plan.    Portions of this note were generated with Scientist, clinical (histocompatibility and immunogenetics). Dictation errors may occur despite best attempts at proofreading.  Final Clinical Impression(s) / ED Diagnoses Final diagnoses:  Sore throat    Rx / DC Orders ED Discharge Orders          Ordered    fluticasone (FLONASE) 50 MCG/ACT nasal spray  Daily        09/21/20 1036    naproxen (NAPROSYN)  500 MG tablet  2 times daily PRN        09/21/20 [address], [place], PA-C 09/21/20 1037    Arby Barrette, MD 10/05/20 1441

## 2020-09-21 NOTE — ED Notes (Signed)
Called lab to check up on status of labs d/t noting that they aren't in process at this time. Per lab tech, they will process them now.

## 2020-09-21 NOTE — Discharge Instructions (Addendum)
You were seen in the ER today for a sore throat and ear discomfort. Your covid, flu, and strep tests were negative.  We have given you a shot of steroids in the ER to help with your symptoms and are sending you home with the following medicines: - Flonase: Use 1 spray per nostril daily to help with congestion  - Naproxen is a nonsteroidal anti-inflammatory medication that will help with pain and swelling. Be sure to take this medication as prescribed with food, 1 pill every 12 hours,  It should be taken with food, as it can cause stomach upset, and more seriously, stomach bleeding. Do not take other nonsteroidal anti-inflammatory medications with this such as Advil, Motrin, Aleve, Mobic, Goodie Powder, or Motrin.    You make take Tylenol per over the counter dosing with these medications.   We have prescribed you new medication(s) today. Discuss the medications prescribed today with your pharmacist as they can have adverse effects and interactions with your other medicines including over the counter and prescribed medications. Seek medical evaluation if you start to experience new or abnormal symptoms after taking one of these medicines, seek care immediately if you start to experience difficulty breathing, feeling of your throat closing, facial swelling, or rash as these could be indications of a more serious allergic reaction   Please follow-up with your primary care provider within 1 week.  Please have your blood pressure rechecked at your follow-up appointment as it was elevated in the ER.  Return to the ER for new or worsening symptoms including but not limited to fever, chest pain, trouble breathing, passing out, coughing up blood, inability to keep fluids down, or any other concerns.

## 2020-09-21 NOTE — ED Notes (Signed)
Reviewed discharge instructions with patient. Follow-up care and medications reviewed. Patient  verbalized understanding. Patient A&Ox4, VSS, and ambulatory with steady gait upon discharge.  °

## 2020-09-21 NOTE — ED Triage Notes (Signed)
C/o sore throat and bilateral ear pain x 3 days.
